# Patient Record
Sex: Female | Born: 2000 | Race: Black or African American | Hispanic: No | Marital: Single | Smoking: Never smoker
Health system: Southern US, Community
[De-identification: ages and names within clinical notes are randomized; demographics above are authoritative.]

## PROBLEM LIST (undated history)

## (undated) HISTORY — PX: NO PAST SURGERIES: SHX2092

---
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
## 2022-11-01 ENCOUNTER — Other Ambulatory Visit: Payer: Self-pay

## 2022-11-01 ENCOUNTER — Emergency Department
Admission: EM | Admit: 2022-11-01 | Discharge: 2022-11-01 | Disposition: A | Discharge status: Home / Self Care | Payer: 59 | Attending: Emergency Medicine | Admitting: Emergency Medicine

## 2022-11-01 DIAGNOSIS — Z1152 Encounter for screening for COVID-19: Secondary | ICD-10-CM | POA: Diagnosis not present

## 2022-11-01 DIAGNOSIS — B349 Viral infection, unspecified: Secondary | ICD-10-CM | POA: Insufficient documentation

## 2022-11-01 DIAGNOSIS — J101 Influenza due to other identified influenza virus with other respiratory manifestations: Secondary | ICD-10-CM | POA: Diagnosis not present

## 2022-11-01 DIAGNOSIS — M791 Myalgia, unspecified site: Secondary | ICD-10-CM | POA: Diagnosis present

## 2022-11-01 LAB — RESP PANEL BY RT-PCR (RSV, FLU A&B, COVID)  RVPGX2
Influenza A by PCR: NEGATIVE
Influenza B by PCR: POSITIVE — AB
Resp Syncytial Virus by PCR: NEGATIVE
SARS Coronavirus 2 by RT PCR: NEGATIVE

## 2022-11-01 MED ORDER — ONDANSETRON 4 MG PO TBDP: 4.0000 mg | ORAL_TABLET | Freq: Three times a day (TID) | ORAL | 0 refills | Status: AC | PRN

## 2022-11-01 MED ORDER — OSELTAMIVIR PHOSPHATE 75 MG PO CAPS: 75.0000 mg | ORAL_CAPSULE | Freq: Two times a day (BID) | ORAL | 0 refills | Status: AC

## 2022-11-01 MED ORDER — IBUPROFEN 600 MG PO TABS
600.0000 mg | ORAL_TABLET | Freq: Once | ORAL | Status: AC
  Administered 2022-11-01: 600 mg via ORAL
  Filled 2022-11-01: qty 1

## 2022-11-01 NOTE — ED Provider Notes (Signed)
   [facility] Provider Note    Event Date/Time   First MD Initiated Contact with Patient 11/01/22 2214     (approximate)  History   Chief Complaint: Generalized Body Aches  HPI  Stephanie Farley is a 22 y.o. female with no past medical history who presents to the emergency department with cough congestion chills body aches that started yesterday.  Patient unknown if she has a fever currently 100.1 in the emergency department.  Patient is at school and states several people in her dorm have recently tested positive for COVID.  Patient denies any nausea or vomiting.  States she has been drinking plenty of fluids and did use Tylenol around 3 PM today.  Physical Exam   Triage Vital Signs: ED Triage Vitals  Enc Vitals Group     BP 11/01/22 2217 134/89     Pulse Rate 11/01/22 2217 91     Resp 11/01/22 2217 18     Temp 11/01/22 2217 100.1 F (37.8 C)     Temp Source 11/01/22 2217 Oral     SpO2 11/01/22 2217 100 %     Weight 11/01/22 2218 135 lb (61.2 kg)     Height 11/01/22 2218 5' 10"$  (1.778 m)     Head Circumference --      Peak Flow --      Pain Score 11/01/22 2217 5     Pain Loc --      Pain Edu? --      Excl. in [employer]? --     Most recent vital signs: Vitals:   11/01/22 2217  BP: 134/89  Pulse: 91  Resp: 18  Temp: 100.1 F (37.8 C)  SpO2: 100%    General: Awake, no distress.  CV:  Good peripheral perfusion.  Regular rate and rhythm  Resp:  Normal effort.  Equal breath sounds bilaterally.  Abd:  No distention.  Soft, nontender.  No rebound or guarding.  ED Results / Procedures / Treatments   MEDICATIONS ORDERED IN ED: Medications  ibuprofen (ADVIL) tablet 600 mg (600 mg Oral Given 11/01/22 2221)     IMPRESSION / MDM / ASSESSMENT AND PLAN / ED COURSE  I reviewed the triage vital signs and the nursing notes.  Patient's presentation is most consistent with acute illness / injury with system symptoms.  Patient presents emergency  department for cough congestion chills and bodyaches since yesterday currently 100.1 temperature.  No shortness of breath no chest pain, clear lung sounds on exam, no abdominal pain or tenderness.  Symptoms are very suggestive of a viral infection possibly COVID or influenza.  We will check a COVID/flu/RSV swab we will dose Motrin and continue to closely monitor.  Patient agreeable to plan.  Patient's test is positive for influenza B likely explaining the patient's symptoms.  Symptoms just darted yesterday we will prescribe Tamiflu to be used if the patient would like to use it.  I have discussed the pros and cons of using Tamiflu with the patient.  We will also prescribe a nausea medication to be used again if needed.  Otherwise discussed supportive care Tylenol or ibuprofen every 6 hours lots of fluids and rest.  Patient agreeable to plan.  FINAL CLINICAL IMPRESSION(S) / ED DIAGNOSES   Viral illness   Note:  This document was prepared using Dragon voice recognition software and may include unintentional dictation errors.   Harvest Dark, MD 11/01/22 2320

## 2022-11-01 NOTE — ED Triage Notes (Signed)
Pt presents to ER with c/o cough, chills, body aches, and congestion that started on Saturday.  Pt states she has not checked her temp yet.  Pt states she is at school currently and that some people in her dorm have tested positive for COVID.  Pt is alert and in NAD at this time.

## 2022-12-15 ENCOUNTER — Ambulatory Visit (INDEPENDENT_AMBULATORY_CARE_PROVIDER_SITE_OTHER): Payer: 59 | Admitting: Student in an Organized Health Care Education/Training Program

## 2022-12-15 ENCOUNTER — Encounter: Payer: Self-pay | Admitting: Student in an Organized Health Care Education/Training Program

## 2022-12-15 ENCOUNTER — Ambulatory Visit
Admission: RE | Admit: 2022-12-15 | Discharge: 2022-12-15 | Disposition: A | Discharge status: Home / Self Care | Payer: 59 | Source: Ambulatory Visit | Attending: Student in an Organized Health Care Education/Training Program | Admitting: Student in an Organized Health Care Education/Training Program

## 2022-12-15 VITALS — BP 118/64 | HR 74 | Temp 98.0°F | Ht 70.0 in | Wt 145.2 lb

## 2022-12-15 DIAGNOSIS — R052 Subacute cough: Secondary | ICD-10-CM

## 2022-12-15 DIAGNOSIS — F17290 Nicotine dependence, other tobacco product, uncomplicated: Secondary | ICD-10-CM

## 2022-12-15 NOTE — Progress Notes (Signed)
Synopsis: Referred in for cough by Sherrie MustacheFisher, Tarri AbernethyGregory Alan, MD  Assessment & Plan:   1. Subacute cough  Presents for the evaluation of subacute cough following a recent influenza infection. We discussed mechanism throughout which cough takes time to resolve. Anticipate 4 to 8 weeks for resolution post viral illness. Vaping use could delay the resolution of said cough. Physical exam today is re-assuring, and there are no rales/rhonchi/wheezing on exam. My suspicion for asthma is low, and I will hold off on ordering any pulmonary function testing. We did discuss vape induced lung injury as a possible complication of vaping use. I doubt this is the case, especially given improvement in symptoms and benign physical exam, but I will obtain a chest xray today to rule this out. Should there be an abnormality on the chest xray, I will follow this up with a chest CT. Patient to call back if symptoms worsen or don't fully resolve.  - DG Chest 2 View; Future  2. Nicotine dependence due to vaping tobacco product  Discussed nicotine dependence in detail with Stephanie Farley. I explained that nicotine dependence can be very strong, and recommended cessation, especially that vaping increases risk for the development of VILI (and possibly other unknown lung conditions). I described the use of nicotine patches and lozenges for cessation aid. Patient has set her self a deadline to quit, but is not quite ready to do so yet. She is also hesitant to use any new medicines. I will provide her with an informational packet and resources to aid in cessation. Patient knows to call or message once ready to stop smoking and we will place a prescription  -smoking cessation counseling given.   Return if symptoms worsen or fail to improve.  No charge - Patent attorneyprofessional courtesy.  Raechel ChuteKhabib Lorel Lembo, MD [facility] 12/15/2022 5:08 PM    End of visit medications:  No orders of the defined types were placed in this  encounter.    Current Outpatient Medications:    amphetamine-dextroamphetamine (ADDERALL) 10 MG tablet, Take 10 mg by mouth daily., Disp: , Rfl:    ondansetron (ZOFRAN-ODT) 4 MG disintegrating tablet, Take 1 tablet (4 mg total) by mouth every 8 (eight) hours as needed for nausea or vomiting. (Patient not taking: Reported on 12/15/2022), Disp: 20 tablet, Rfl: 0   Subjective:   PATIENT ID: Stephanie Farley GENDER: female DOB: 2001/04/29, MRN: [id number]  Chief Complaint  Patient presents with   pulmonary consult    Flu 10/2022--lingering prod cough with clear to greenish sputum    HPI  Patient is a pleasant 22 year old female presenting to clinic for the evaluation of cough.  Patient reports being in her usual state of health up until February of this year when she contracted influenza, and developed an incessant cough that was productive of greenish sputum. Patient reports that the cough has actually improved, and has almost resolved. She has no shortness of breath and is able to do all the things she wants. Able to walk a lot, and has no limitation going up stairs. No history of cold induced asthma. Does report feeling she needed to catch her breath with soccer exercise as a teen, but that was with strenuous exercise. She does still experience sporadic bouts of cough. She experiences chest tightness with the cough but not outside of it. No wheezing reported, no fevers, and no chills. Patient has no personal history of asthma.  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 She reports vaping nicotine products. Reports using the vape multiple times a day, goes through a bar fairly quickly. Contemplating quitting.  Ancillary information including prior medications, full medical/surgical/family/social histories, and PFTs (when available) are listed below and have been reviewed.   Review of Systems  Constitutional:  Negative for chills, fever, malaise/fatigue and weight loss.   Respiratory:  Positive for cough and sputum production. Negative for hemoptysis, shortness of breath and wheezing.   Cardiovascular:  Negative for chest pain.  Skin:  Negative for rash.    Objective:   Vitals:   12/15/22 1637  BP: 118/64  Pulse: 74  Temp: 98 F (36.7 C)  TempSrc: Temporal  SpO2: 100%  Weight: 145 lb 3.2 oz (65.9 kg)  Height: 5\' 10"  (1.778 m)   100% on RA BMI Readings from Last 3 Encounters:  12/15/22 20.83 kg/m  11/01/22 19.37 kg/m  07/08/19 25.84 kg/m (87 %, Z= 1.14)*   * Growth percentiles are based on CDC (Girls, 2-20 Years) data.   Wt Readings from Last 3 Encounters:  12/15/22 145 lb 3.2 oz (65.9 kg)  11/01/22 135 lb (61.2 kg)  09/07/19 172 lb (78 kg) (94 %, Z= 1.57)*   * Growth percentiles are based on CDC (Girls, 2-20 Years) data.    Physical Exam Constitutional:      General: She is not in acute distress.    Appearance: Normal appearance. She is normal weight. She is not ill-appearing.  Cardiovascular:     Rate and Rhythm: Normal rate and regular rhythm.     Pulses: Normal pulses.     Heart sounds: Normal heart sounds.  Pulmonary:     Effort: Pulmonary effort is normal.     Breath sounds: Normal breath sounds. No wheezing, rhonchi or rales.  Neurological:     General: No focal deficit present.     Mental Status: She is alert and oriented to person, place, and time. Mental status is at baseline.      Ancillary Information    No past medical history on file.   Family History  Problem Relation Age of Onset   Healthy Mother    Healthy Father      Past Surgical History:  Procedure Laterality Date   NO PAST SURGERIES      Social History   Socioeconomic History   Marital status: Single    Spouse name: Not on file   Number of children: Not on file   Years of education: Not on file   Highest education level: Not on file  Occupational History   Not on file  Tobacco Use   Smoking status: Never   Smokeless tobacco: Never   Vaping Use   Vaping Use: Never used  Substance and Sexual Activity   Alcohol use: Not Currently   Drug use: Not Currently   Sexual activity: Not on file  Other Topics Concern   Not on file  Social History Narrative   Not on file   Social Determinants of Health   Financial Resource Strain: Not on file  Food Insecurity: Not on file  Transportation Needs: Not on file  Physical Activity: Not on file  Stress: Not on file  Social Connections: Not on file  Intimate Partner Violence: Not on file     No Known Allergies   CBC    Component Value Date/Time   WBC 8.6 07/09/2019 0001   RBC 3.92 07/09/2019 0001   HGB 10.4 (L) 07/09/2019 0001   HCT 33.3 (  L) 07/09/2019 0001   PLT 231 07/09/2019 0001   MCV 84.9 07/09/2019 0001   MCH 26.5 07/09/2019 0001   MCHC 31.2 07/09/2019 0001   RDW 12.6 07/09/2019 0001   LYMPHSABS 2.9 07/09/2019 0001   MONOABS 0.7 07/09/2019 0001   EOSABS 0.3 07/09/2019 0001   BASOSABS 0.1 07/09/2019 0001    Pulmonary Functions Testing Results:     No data to display          Outpatient Medications Prior to Visit  Medication Sig Dispense Refill   amphetamine-dextroamphetamine (ADDERALL) 10 MG tablet Take 10 mg by mouth daily.     ondansetron (ZOFRAN-ODT) 4 MG disintegrating tablet Take 1 tablet (4 mg total) by mouth every 8 (eight) hours as needed for nausea or vomiting. (Patient not taking: Reported on 12/15/2022) 20 tablet 0   No facility-administered medications prior to visit.

## 2022-12-15 NOTE — Patient Instructions (Signed)
[facility]: Call 1-800-QUIT-NOW (1-800-784-8669). The [facility] [facility]uit[facility] is a free service for [place] residents. Trained counselors are available from 8 am until 3 am, 365 days per year. Services are available in both English and Spanish.   Web Resources Free online support programs can help you track your progress and share experiences with others who are quitting. These are examples: www.becomeanex.org www.trytostop.org  www.smokefree.gov  www.everydayhealth.com/smoking-cessation/index.aspx  [facility]: offers comprehensive in-person tobacco treatment counseling at [facility] building ([address] [place] [address]).  Open to everyone. Virtual appointments available. Free parking. Call [phone] to schedule an appointment or [phone] for general information.    Tobacco Cessation Medications  Nicotine Replacement Therapy (NRT)  Nicotine is the addictive part of tobacco smoke, but not the most dangerous part. There are 7000 other toxins in cigarettes, including carbon monoxide, that cause disease. People do not generally become addicted to medication. Common problems: People don't use enough medication or stop too early. Medications are safe and effective. Overdose is very uncommon. Use medications as long as needed (3 months minimum). Some combinations work better than single medications. Long acting medications like the NRT patch and bupropion provide continuous treatment for withdrawal symptoms.  PLUS  Short acting medications like the NRT gum, lozenge, inhaler, and nasal spray help people to cope with breakthrough cravings.  ? Nicotine Patch  Place patch on hairless skin on upper body, including arms and back. Each day: discard old patch, shower, apply new patch to a different site. Apply hydrocortisone cream to mildly red/irritated areas. Call provider if rash develops. If patch causes sleep disturbance, remove patch  at bedtime and replace each morning after shower. Side effects may include: skin irritation, headache, insomnia, abnormal/vivid dreams.  ? Nicotine Gum  Chew gum slowly, park in cheek when peppery taste or tingling sensation begins (about 15-30 chews). When taste or tingling goes away, begin chewing again. Use until nicotine is gone (taste or tingle does not return, usually 30 minutes). Park in different areas of mouth. Nicotine is absorbed through the lining of the mouth. Use enough to control cravings, up to 24 pieces per day (if used alone). Avoid eating or drinking for 15 minutes before using and during use. Side effects may include: mouth/jaw soreness, hiccups, indigestion, hypersalivation.  If gum is not chewed correctly, additional side effects may include lightheadedness, nausea/vomiting, throat and mouth irritation.  ? Nicotine Lozenge  Allow to dissolve slowly in mouth (20-30 minutes). Do not chew or swallow. Nicotine release may cause a warm tingling sensation. Occasionally rotate to different areas of the mouth. Use enough to control cravings, up to 20 lozenges per day (if used alone). Avoid eating or drinking for 15 minutes before using and during use. Side effects may include: nausea, hiccups, cough, heartburn, headache, gas, insomnia.  ? Nicotine Nasal Spray Use 1 spray in each nostril (1 dose) and tilt head back for 1 minute. Do not sniff, swallow, or inhale through nose.  Use at least 8 doses (1 spray in each nostril) , up to 40 doses per day (if used alone). To reduce nasal irritation, spray on cotton swab and insert into nose. Side effects may include: nasal and/or throat irritation (hot, peppery, or burning sensation), nasal irritation, tearing, sneezing, cough, headache.  ? Nicotine Oral Inhaler (puffer) Inhale into the back of the throat or puff in short breaths. Do not inhale into the lungs.  Puff continuously for 20 minutes (about 80 puffs) until cartridge   is  empty. Change cartridge when it loses the "burning in throat" sensation (feels like air only). Open cartridges can be saved and used again within 24 hours. Use at least 6 and up to 16 cartridges per day (if used alone).  Avoid eating or drinking for 15 minutes before using and during use. Side effects may include: mouth and/or throat irritation, unpleasant taste, cough, nasal irritation, indigestion, hiccups, headache.  ? Chantix (varenicline) Days 1-3: Take one 0.5 mg white pill each morning for 3 days, one week before quit date. Days 4-7: Increase to one 0.5 mg white pill twice a day in morning and evening for 4 days.  On Day 8 (target quit date), increase to one 1 mg blue pill twice a day. Maintain this dose for a minimum of 3 months. Take with food and a full glass of water to reduce nausea. Be sure that the two doses are at least 8 hours apart, but try to take second dose early in the evening (i.e. 6 pm) to avoid sleep problems. Common side effects include: nausea, insomnia, headache, abnormal/vivid dreams. Tell your doctor if you have any history of psychiatric illness prior to starting Chantix.  STOP taking CHANTIX and contact a healthcare provider immediately if you experience agitation, hostility, depressed mood, changes in thoughts or behavior that are not typical for you, thinking about or attempting suicide, allergic or skin reactions including swelling, rash, redness, or peeling of the skin.  For patients who have heart disease: Smoking is a major risk factor for cardiovascular disease, and Chantix can help you quit smoking. Chantix may be associated with a small, increased risk of certain heart events in patients who have heart disease. If you have any new or worsening symptoms of heart disease while taking Chantix, such as shortness of breath or trouble breathing, new or worsening chest pain, or new or worsening pain in your legs when walking, call your doctor or get emergency medical  help immediately.  ? Wellbutrin / Zyban (bupropion) Take one 150 mg pill each morning for 3 days, one week before target quit date. On Day 4, increase to one 150 mg pill twice a day, morning and evening.  Maintain this dose for a minimum of 3 months. Be sure that the two doses are at least 8 hours apart, but try to take second dose early in the evening (i.e. 6 pm) to avoid sleep problems. Avoid or minimize use of alcohol when taking this medication. Common side effects include: dry mouth, headache, insomnia, nausea, weight loss.  Risk of seizure is 09/998. STOP taking BUPROPION and contact a healthcare provider immediately if you experience agitation, hostility, depressed mood, changes in thoughts or behavior that are not typical for you, thinking about or attempting suicide, allergic or skin reactions including swelling, rash, redness, or peeling of the skin.
